# Patient Record
Sex: Female | Born: 1970 | Race: White | Hispanic: No | Marital: Married | Smoking: Former smoker
Health system: Southern US, Community
[De-identification: ages and names within clinical notes are randomized; demographics above are authoritative.]

## PROBLEM LIST (undated history)

## (undated) DIAGNOSIS — R609 Edema, unspecified: Secondary | ICD-10-CM

## (undated) DIAGNOSIS — R61 Generalized hyperhidrosis: Secondary | ICD-10-CM

## (undated) HISTORY — DX: Generalized hyperhidrosis: R61

## (undated) HISTORY — PX: INTRAUTERINE DEVICE (IUD) INSERTION: SHX5877

## (undated) HISTORY — PX: MOUTH SURGERY: SHX715

## (undated) HISTORY — PX: FOOT SURGERY: SHX648

## (undated) HISTORY — PX: OTHER SURGICAL HISTORY: SHX169

## (undated) HISTORY — DX: Edema, unspecified: R60.9

---

## 1998-08-16 HISTORY — PX: MANDIBLE SURGERY: SHX707

## 2001-11-21 ENCOUNTER — Other Ambulatory Visit: Admission: RE | Admit: 2001-11-21 | Discharge: 2001-11-21 | Payer: Self-pay | Admitting: Obstetrics and Gynecology

## 2001-12-29 ENCOUNTER — Ambulatory Visit (HOSPITAL_COMMUNITY): Admission: RE | Admit: 2001-12-29 | Discharge: 2001-12-29 | Payer: Self-pay | Admitting: Obstetrics and Gynecology

## 2001-12-29 ENCOUNTER — Encounter: Payer: Self-pay | Admitting: Obstetrics and Gynecology

## 2002-06-01 ENCOUNTER — Encounter (HOSPITAL_COMMUNITY): Admission: RE | Admit: 2002-06-01 | Discharge: 2002-06-15 | Payer: Self-pay | Admitting: Obstetrics and Gynecology

## 2002-06-15 ENCOUNTER — Inpatient Hospital Stay (HOSPITAL_COMMUNITY): Admission: AD | Admit: 2002-06-15 | Discharge: 2002-06-20 | Payer: Self-pay | Admitting: Obstetrics and Gynecology

## 2002-12-05 ENCOUNTER — Other Ambulatory Visit: Admission: RE | Admit: 2002-12-05 | Discharge: 2002-12-05 | Payer: Self-pay | Admitting: Obstetrics and Gynecology

## 2004-04-13 ENCOUNTER — Other Ambulatory Visit: Admission: RE | Admit: 2004-04-13 | Discharge: 2004-04-13 | Payer: Self-pay | Admitting: Obstetrics and Gynecology

## 2005-12-03 ENCOUNTER — Other Ambulatory Visit: Admission: RE | Admit: 2005-12-03 | Discharge: 2005-12-03 | Payer: Self-pay | Admitting: Obstetrics and Gynecology

## 2006-02-02 ENCOUNTER — Inpatient Hospital Stay (HOSPITAL_COMMUNITY): Admission: AD | Admit: 2006-02-02 | Discharge: 2006-02-02 | Payer: Self-pay | Admitting: Obstetrics and Gynecology

## 2006-05-27 ENCOUNTER — Inpatient Hospital Stay (HOSPITAL_COMMUNITY): Admission: RE | Admit: 2006-05-27 | Discharge: 2006-05-31 | Payer: Self-pay | Admitting: Obstetrics and Gynecology

## 2008-07-18 ENCOUNTER — Encounter (INDEPENDENT_AMBULATORY_CARE_PROVIDER_SITE_OTHER): Payer: Self-pay | Admitting: Obstetrics and Gynecology

## 2008-07-18 ENCOUNTER — Ambulatory Visit (HOSPITAL_COMMUNITY): Admission: RE | Admit: 2008-07-18 | Discharge: 2008-07-18 | Payer: Self-pay | Admitting: Obstetrics and Gynecology

## 2009-05-28 ENCOUNTER — Encounter: Admission: RE | Admit: 2009-05-28 | Discharge: 2009-05-28 | Payer: Self-pay | Admitting: Obstetrics and Gynecology

## 2010-12-29 NOTE — H&P (Signed)
NAME:  Teresa Ashley, Teresa Ashley NO.:  [id number]   MEDICAL RECORD NO.:  [id number]          PATIENT TYPE:  AMB   LOCATION:  SDC                           FACILITY:  [facility]   PHYSICIAN:  Huel Cote, M.D. DATE OF BIRTH:  [birth year]   DATE OF ADMISSION:  DATE OF DISCHARGE:                              HISTORY & PHYSICAL   PREOPERATIVE HISTORY AND PHYSICAL:  The patient is a 40 year old G3, P 2-  0-0-2 who is coming in for a scheduled suction dilation and evacuation  given an unfortunate finding of a fetal demise at 11+ weeks gestation.  The patient was having no significant symptoms and came in for a routine  ultrasound and was found to have absent cardiac activity on her fetus.  After given all options, she elected to proceed with suction D and E.   PAST OBSTETRICAL HISTORY:  Is significant for two prior pregnancies,  both delivered by C-section in 2003 and 2007.   PAST GYNECOLOGICAL HISTORY:  Cryosurgery in the 1990s.   PAST SURGICAL HISTORY:  1. C-section x2.  2. Breast augmentation.  3. Jaw surgery.   PAST MEDICAL HISTORY:  None.   FAMILY HISTORY:  No breast cancer.   MEDICATIONS:  Prenatal vitamins only.   PHYSICAL EXAM:  Blood pressure is 108/60.  CARDIAC:  Exam is regular rate and rhythm.  LUNGS:  Clear.  ABDOMEN:  Is soft and nontender.  PELVIC EXAM:  The uterus is consistent with an 11-[redacted] week gestation.  Adnexa have no masses.   The patient was counseled as to all options including Cytotec and  expected management.  Given that she is rather advanced in the first  trimester, we discussed the possibility of significant bleeding when the  pregnancy tissue should indeed pass.  Because of these issues she has  elected to proceed with a suction dilation and evacuation.  We  discussed the risk of the procedure including bleeding and uterine  perforation specifically.  She understands these risks and desires to  proceed with the surgery as stated.  We also  discussed the possibility  that we could send the tissue for genetics and the patient will discuss  this with her husband and decide if she wishes to proceed with this.      Huel Cote, M.D.  Electronically Signed     KR/MEDQ  D:  07/17/2008  T:  07/17/2008  Job:  [id number]

## 2010-12-29 NOTE — Op Note (Signed)
Teresa Ashley, CHAUNCEY               ACCOUNT NO.:  [id number]   MEDICAL RECORD NO.:  [id number]          PATIENT TYPE:  AMB   LOCATION:  SDC                           FACILITY:  [facility]   PHYSICIAN:  Huel Cote, M.D. DATE OF BIRTH:  March 09, 1971   DATE OF PROCEDURE:  07/18/2008  DATE OF DISCHARGE:                               OPERATIVE REPORT   PREOPERATIVE DIAGNOSIS:  Missed abortion at 12+ weeks.   POSTOPERATIVE DIAGNOSIS:  Missed abortion at 12+ weeks.   PROCEDURE:  Suction, dilation, and evacuation.   SURGEON:  Huel Cote, MD   ASSISTANT:  None.   ANESTHESIA:  LMA with 1% lidocaine plain paracervical block.   FINDINGS:  Large amount of POCs noted.  Uterus was prominent at 3 below  the umbilicus.   SPECIMEN:  Large amount of POCs were obtained and sent to Pathology as  well as chromosomal analysis.   ESTIMATED BLOOD LOSS:  100 mL.   URINE OUTPUT:  50 mL straight cath prior to procedure.   IV FLUIDS:  1200 mL of LR.   DESCRIPTION OF PROCEDURE:  The patient was taken to the operating room,  where LMA anesthesia was obtained without difficulty.  She was then  prepped and draped in normal sterile fashion in dorsal lithotomy  position.  After a time-out was performed, the cervix was identified,  grasped with single-tooth tenaculum, and a paracervical block performed  at 2 and 10 o'clock with 10 mL of lidocaine.  The cervix was noted to be  partially dilated already from her Cytotec use and sounded to  approximately 16.  The cervix was then easily sequentially dilated with  a Pratt dilator to a maximum of 36 and the 12-mm suction curette was  attempted to be placed in the uterine fundus.  It was slightly difficult  to advance this into the fundus, so therefore this was abandoned, and a  10-mm suction curette easily introduced into the fundus.  This was then  applied to suction, and a large amount of products of conception were  obtained in multiple passes.  When there  was no further tissue  apparently forthcoming, the suction was discontinued and a gentle  curettage performed in all quadrants.  No other significant tissue was  noted; however, it was slightly soft on the patient's left side.  Therefore, the suction was reintroduced and no further significant  tissue obtained.  Gentle curettage was once again performed, and no  further bleeding or tissue noted.  The uterus was examined and felt to  be more consistent with a 6-8 weeks' size now, and therefore all  instruments and sponges were removed from the patient's vagina.  Her  bleeding was observed and was minimal.  The tenaculum was removed and  did bleed  somewhat at the tenaculum site, which was controlled with silver nitrate  as well as a tenaculum.  The bleeding then completely hemostatic.  All  instruments, sponges were removed from the vagina, and the patient was  awakened and taken to the recovery room in good condition.      Huel Cote, M.D.  Electronically Signed     KR/MEDQ  D:  07/18/2008  T:  07/18/2008  Job:  [id number]

## 2011-01-01 NOTE — Op Note (Signed)
NAME:  Teresa Ashley, Teresa Ashley                         ACCOUNT NO.:  [id number]   MEDICAL RECORD NO.:  [id number]                   PATIENT TYPE:  INP   LOCATION:  9125                                 FACILITY:  [facility]   PHYSICIAN:  Zenaida Niece, M.D.             DATE OF BIRTH:  Apr 28, 1971   DATE OF PROCEDURE:  06/16/2002  DATE OF DISCHARGE:                                 OPERATIVE REPORT   PREOPERATIVE DIAGNOSES:  1. Intrauterine pregnancy at 42 weeks'.  2. Arrest of dilatation.   POSTOPERATIVE DIAGNOSES:  1. Intrauterine pregnancy at 42 weeks'.  2. Arrest of dilatation.   PROCEDURE:  Primary low transverse cesarean section without extensions.   SURGEON:  Zenaida Niece, M.D.   ANESTHESIA:  Epidural.   ESTIMATED BLOOD LOSS:  1000 cc.   FINDINGS:  The patient had normal anatomy and delivered a viable female  infant with Apgars of 8 and 9 that weight 8 pounds 10 ounces.   PROCEDURE IN DETAIL:  The patient was taken to the operating room and placed  in the dorsal supine position with left lateral tilt.  Her previously placed  epidural was dosed appropriately and her abdomen was prepped and draped in  the usual sterile fashion.  The level of her anesthesia was found to be  adequate and her abdomen was entered via a standard Pfannenstiel incision.  The vesicouterine peritoneum was incised and a bladder flap created  digitally.  The 4-cm transverse incision was made in the lower uterine  segment.  Once the uterine cavity was entered, the incision was extended  bilaterally digitally.  The fetal vertex was grasped and delivered through  the incision atraumatically.  The mouth and nares were suctioned and the  remainder of the infant delivered atraumatically.  The cord was doubly  clamped and cut and the infant handed to the waiting pediatric team.  Cord  blood and a segment of cord for gas were obtained and the placenta delivered  spontaneously.  The uterus was wiped dry with a  clean lap pad and found to  be normal.  The uterine incision was inspected and found to be free of  extensions.  The uterine incision was closed in one layer being a running,  locking layer with 1 chromic.  Bleeding from the left angle was controlled  with two figure-of-eight sutures of 1 chromic and bleeding from serosal  edges was confirmed with electrocautery.  Both tubes and ovaries were  inspected and found to be hemostatic.  The incision was again inspected and  found to be hemostatic.  The subfascial space was then irrigated and made  hemostatic with electrocautery.  The fascia was closed in a running fashion  with 0 Vicryl.  Subcutaneous tissue irrigated and made  hemostatic with electrocautery and the skin was closed with staples and a  sterile dressing.  The patient tolerated the procedure well and was taken  to  the recovery room in stable condition.  Counts were correct x2 and she was  given Ancef 1 g after cord clamped.                                                Zenaida Niece, M.D.    TDM/MEDQ  D:  06/16/2002  T:  06/16/2002  Job:  [id number]

## 2011-01-01 NOTE — Discharge Summary (Signed)
NAME:  Teresa Ashley, Teresa Ashley                         ACCOUNT NO.:  [id number]   MEDICAL RECORD NO.:  [id number]                   PATIENT TYPE:  INP   LOCATION:  9125                                 [facility]ACILITY:  [facility]   PHYSICIAN:  Zenaida Niece, M.D.             DATE OF BIRTH:  Dec 19, 1970   DATE OF ADMISSION:  06/15/2002  DATE OF DISCHARGE:  06/20/2002                                 DISCHARGE SUMMARY   ADMISSION DIAGNOSES:  1. Intrauterine pregnancy at 42 weeks.  2. Group B Strep carrier.   DISCHARGE DIAGNOSES:  1. Intrauterine pregnancy at 42 weeks.  2. Group B Strep carrier.  3. Arrest of dilation.   PROCEDURE:  On November 1 she had a primary low transverse cesarean section.   HISTORY AND PHYSICAL:  This is a 40 year old white female gravida 1, para 0  with an EGA of [redacted] weeks by an LMP consistent with a 10 week ultrasound with  a due date of October 16 who presents for induction due to post dates.  She  has had a few contractions, good fetal movement, no bleeding, and no rupture  of membranes.  Prenatal care has been essentially uncomplicated with  reactive nonstress tests since going past her due date.   PRENATAL LABORATORIES:  Blood type is O+ with a negative antibody screen.  RPR nonreactive.  Rubella immune.  Hepatitis B surface antigen negative.  Gonorrhea and Chlamydia negative.  Genital HSV culture was negative.  Triple  screen normal.  One hour Glucola 108.  Group B Strep is positive.   GYN HISTORY:  History of cryo therapy in the 1990s.   PAST SURGICAL HISTORY:  1. Fractured jaw.  2. Tonsillectomy.  3. Breast augmentation.   ALLERGIES:  CODEINE.   PHYSICAL EXAMINATION:  VITAL SIGNS:  She is afebrile with stable vital  signs.  Fetal heart tracing is reactive with irregular contractions.  ABDOMEN:  Gravid, nontender with an estimated fetal weight of 8 pounds.  PELVIC:  Vaginal examination is fingertip, 80, -2, vertex presentation.   HOSPITAL COURSE:  The  patient was admitted and started on Pitocin for labor  induction.  On the afternoon of October 31 she was 1, 90, and -1 and I was  able to perform amniotomy which revealed clear fluid.  At that time she was  started on penicillin for group B Strep prophylaxis.  She entered active  labor and received an epidural.  However, she reached 9 cm and stayed there  for approximately three to four hours without change.  She appeared to be  having adequate contractions.  At that time the situation was discussed with  the patient and her husband and we agreed to proceed with a cesarean  section.  Early on the morning of November 1 she had a primary low  transverse cesarean section without extension under epidural anesthesia.  Estimated blood loss was 1000 cc.  She  delivered a viable female infant with  Apgars of 8 and 9 that weighed 8 pounds 10 ounces.  Postoperatively she did  very well, was rapidly able to ambulate and tolerate a diet, and remained  afebrile.  Pre delivery hemoglobin 12.9, post delivery 9.9.  The patient  wished to stay until the morning of November 5 which was postoperative day  number four.  At that time her incision was healing well and her staples  were removed and Steri-Strips applied.  She was felt to be stable enough for  discharge home.   DISCHARGE INSTRUCTIONS:  Regular diet.  Pelvic rest.  No strenuous activity.  Medications are Percocet number 30 one to two p.o. q.4-6h. p.r.n. as well as  over-the-counter Motrin or Aleve p.r.n.  She is given our discharge  pamphlet.                                               Zenaida Niece, M.D.    TDM/MEDQ  D:  06/20/2002  T:  06/20/2002  Job:  [id number]

## 2011-01-01 NOTE — H&P (Signed)
Teresa Ashley, Teresa Ashley               ACCOUNT NO.:  [id number]   MEDICAL RECORD NO.:  [id number]          PATIENT TYPE:  INP   LOCATION:  9142                          [facility]ACILITY:  [facility]   PHYSICIAN:  Huel Cote, M.D. DATE OF BIRTH:  1970/10/08   DATE OF ADMISSION:  05/27/2006  DATE OF DISCHARGE:                                HISTORY & PHYSICAL   ANTICIPATED DATE OF SURGERY:  May 27, 2006.   HISTORY OF THE PRESENT ILLNESS:  The patient is a 40 year old gravida 2,  para 1, 0, 0, 1 who is coming in at essentially [redacted] weeks gestation for a  repeat cesarean section given a history of a prior cesarean section and  declining a trial of labor.  Her prenatal care had been essentially  uncomplicated.  She does have advanced maternal age and therefore underwent  first trimester screening, which was normal.  She is a group B Strep  carrier; and, aside from her previous cesarean section, which was performed  in 2003 for arrest of dilation, she had no other significant abnormalities  in her prenatal course.   PRENATAL LABORATORY DATA:  The patient's prenatal labs are as follows:  O  positive, antibody negative.  RPR nonreactive.  Rubella immune.  Hepatitis B  surface antigen negative.  HIV negative.  GC and Chlamydia negative.  First  trimester screen normal.  Group B Strep positive.   PAST OBSTETRICAL HISTORY:  Significant for the cesarean section, low  transverse, performed in November 2003 of an 8 pound 10 ounce female infant.   PAST GYNECOLOGICAL HISTORY:  The patient had cryosurgery performed in the  1990s.  Paps have been normal since that time.   PAST MEDICAL HISTORY:  None.   PAST SURGICAL HISTORY:  1. The patient had jaw surgery times two in 2000.  2. Breast augmentation in 1996.  3. Tonsillectomy in 1976.   ALLERGIES:  The patient has an allergy only to CODEINE.   MEDICATIONS:  The patient is currently taking her prenatal vitamins only.   PHYSICAL EXAMINATION:  VITAL  SIGNS:  On physical exam her weight is 181  pounds, blood pressure is 134/80.  HEART:  Cardiac examination reveals regular rate and rhythm.  LUNGS:  The lungs are clear.  ABDOMEN:  The abdomen is soft, gravid and nontender.  Estimated fetal weight  is 8.5-9 pounds.  PELVIC EXAMINATION:  Cervix is long and closed.   NOTE:  The patient was counseled as to the risks and benefits of proceeding  with a cesarean section including bleeding, infection and possible damage to  bowel and/or bladder.  She understands that she has a slightly increased  risk of more blood loss with a cesarean section; however, would not  routinely need a transfusion only if this was necessary due to emergency  bleeding.  She is okay with this.  The procedure was discussed with her in  detail and she is agreeable to proceeding.      Huel Cote, M.D.  Electronically Signed     KR/MEDQ  D:  05/26/2006  T:  05/27/2006  Job:  [id number]

## 2011-01-01 NOTE — Op Note (Signed)
NAMEGLADYS, Teresa Ashley               ACCOUNT NO.:  [id number]   MEDICAL RECORD NO.:  [id number]          PATIENT TYPE:  INP   LOCATION:  9142                          [facility]ACILITY:  [facility]   PHYSICIAN:  Huel Cote, M.D. DATE OF BIRTH:  1971/01/12   DATE OF PROCEDURE:  05/27/2006  DATE OF DISCHARGE:                                 OPERATIVE REPORT   PREOPERATIVE DIAGNOSES:  1. Term pregnancy at 39 weeks. delivered.  2. Previous cesarean section, declines vaginal birth after cesarean.   POSTOPERATIVE DIAGNOSES:  1. Term pregnancy at 39 weeks. delivered.  2. Previous cesarean section, declines vaginal birth after cesarean.   PROCEDURE:  Repeat low transverse C-section, with two-layer closure of  uterus.   SURGEON:  Huel Cote, M.D.   ASSISTANT:  Zenaida Niece, M.D.   ANESTHESIA:  Spinal.   Placenta was sent to labor and delivery.   ESTIMATED BLOOD LOSS:  800 cc.   IV FLUIDS:  2200 cc LR.   URINE OUTPUT:  150 cc clear urine.   FINDINGS:  There was a vigorous female infant in vertex presentation.  Apgars  were 8 and 9.  Weight was 8 pounds 13 ounces. Normal uterus, ovaries, and  tubes were noted.   PROCEDURE:  The patient was taken to the operating room where spinal  anesthesia was obtained without difficulty.  She was then prepped and draped  in the normal sterile fashion in the dorsal supine position with a leftward  tilt. After Allis clamp test confirmed that her spinal was functioning, a  Pfannenstiel skin incision was made, with Foley catheter in place.  This was  carried down to the underlying layer of fascia by sharp dissection and Bovie  cautery.  Fascia was opened in the midline.  The incision was extended  laterally.  The inferior aspect was grasped with Kocher clamps and dissected  off the underlying rectus muscles.  In similar fashion, the superior aspect  was dissected off the rectus muscles.  The rectus muscles were separated  midline, and the  peritoneal incision was extended both superiorly and  inferiorly, with careful attention to avoid both bowel and bladder.  The  Alexis self-retaining large wound retractor was then placed within the  incision, and the lower uterine segment was then exposed nicely.  The lower  uterine segment was then incised in a transverse fashion, and the uterine  cavity itself entered bluntly.  This incision was then extended bluntly.  The infant's head was then delivered atraumatically.  The mouth and nose  were bulb suctioned, and the remainder of the infant delivered without  difficulty.  The cord was clamped and cut.  The infant was handed off to the  awaiting pediatricians.  Cord blood was obtained, and the placenta was  delivered intact and clamped for cord blood donation. The uterus was then  cleared of all clots and debris with a moist lap sponge.  There was some  mild atony which required bimanual massage and Pitocin. However, this  resolved well.  The uterine incision was then closed with 0 chromic in a  running-locked  fashion.  A  second layer of the same was used in an  imbricating fashion.  Good hemostasis was noted at the end of the closure.  The uterus and ovaries and tubes appeared normal. At this point, all  instruments and sponges were removed from the patient's abdomen, and the  abdomen and pelvis irrigated, with no active bleeding noted.  All appeared  hemostatic in the subfascial planes. Therefore, the fascia was closed with 0  Vicryl in a running fashion, and the skin was closed with staples.  Sponge,  lap, and needle counts were correct x2, and the patient was taken to the  recovery room in stable condition.      Huel Cote, M.D.  Electronically Signed     KR/MEDQ  D:  05/27/2006  T:  05/28/2006  Job:  [id number]

## 2011-01-01 NOTE — Discharge Summary (Signed)
Teresa Ashley, Teresa Ashley               ACCOUNT NO.:  [id number]   MEDICAL RECORD NO.:  [id number]          PATIENT TYPE:  INP   LOCATION:  9142                          [facility]ILITY[facility]   PHYSICIAN:  Huel Cote, M.D. DATE OF BIRTH:  [birth year]   DATE OF ADMISSION:  05/27/2006  DATE OF DISCHARGE:  05/31/2006                               DISCHARGE SUMMARY   DISCHARGE DIAGNOSES:  1. Term pregnancy at 39 weeks, delivered.  2. Previous cesarean section, declining vaginal birth after cesarean      section.  3. Status post repeat low-transverse cesarean section with 2-layer      closure of uterus.   DISCHARGE MEDICATIONS:  1. Motrin 600 mg p.o. every 6 hours.  2. Percocet 1-2 tablets p.o. every 4 hours p.r.n.   DISCHARGE FOLLOWUP:  The patient is to follow up in the office in 2  weeks for an incision check.   HOSPITAL COURSE:  The patient is a 40 year old G2, P1-0-0-1 who was  admitted for a repeat low-transverse C-section, which she underwent on  May 27, 2006, without difficulty.  She was delivered of a vigorous  female infant, Apgars were 8 and 9, weight was 8 pounds 13 ounces.  She  had normal anatomy noted at the time of C-section.  She was then  admitted for routine postoperative care.  She did quite well.   On postop day #1, her hemoglobin was noted to be stable at 10.5.  Throughout her postoperative course, she remained afebrile with stable  vital signs, and her pain became well controlled.   She was afebrile with stable vital signs on postop day #4.  Her staples  were removed, Steri-Strips placed, and she was given prescriptions for  Motrin and Percocet as stated.  The patient was also given discharge  booklet and instructions on pelvic rest.  She will follow up in the  office in 2 weeks for an incision check.      Huel Cote, M.D.  Electronically Signed     KR/MEDQ  D:  07/07/2006  T:  07/07/2006  Job:  [id number]

## 2011-03-29 ENCOUNTER — Other Ambulatory Visit: Payer: Self-pay | Admitting: Obstetrics and Gynecology

## 2011-03-29 DIAGNOSIS — Z1231 Encounter for screening mammogram for malignant neoplasm of breast: Secondary | ICD-10-CM

## 2011-04-08 ENCOUNTER — Ambulatory Visit
Admission: RE | Admit: 2011-04-08 | Discharge: 2011-04-08 | Disposition: A | Discharge status: Home / Self Care | Payer: BC Managed Care – PPO | Source: Ambulatory Visit | Attending: Obstetrics and Gynecology | Admitting: Obstetrics and Gynecology

## 2011-04-08 DIAGNOSIS — Z1231 Encounter for screening mammogram for malignant neoplasm of breast: Secondary | ICD-10-CM

## 2011-05-20 LAB — CBC
HCT: 39.1 % (ref 36.0–46.0)
Hemoglobin: 13.5 g/dL (ref 12.0–15.0)
MCHC: 34.4 g/dL (ref 30.0–36.0)
MCV: 90.1 fL (ref 78.0–100.0)
Platelets: 187 10*3/uL (ref 150–400)
RBC: 4.34 MIL/uL (ref 3.87–5.11)
RDW: 14.3 % (ref 11.5–15.5)
WBC: 8.2 10*3/uL (ref 4.0–10.5)

## 2011-12-30 ENCOUNTER — Ambulatory Visit
Admission: RE | Admit: 2011-12-30 | Discharge: 2011-12-30 | Disposition: A | Discharge status: Home / Self Care | Payer: BC Managed Care – PPO | Source: Ambulatory Visit | Attending: Family Medicine | Admitting: Family Medicine

## 2011-12-30 ENCOUNTER — Other Ambulatory Visit: Payer: Self-pay | Admitting: Family Medicine

## 2011-12-30 DIAGNOSIS — M25572 Pain in left ankle and joints of left foot: Secondary | ICD-10-CM

## 2012-04-24 ENCOUNTER — Other Ambulatory Visit: Payer: Self-pay | Admitting: Obstetrics and Gynecology

## 2012-04-24 DIAGNOSIS — Z1231 Encounter for screening mammogram for malignant neoplasm of breast: Secondary | ICD-10-CM

## 2012-05-22 ENCOUNTER — Ambulatory Visit
Admission: RE | Admit: 2012-05-22 | Discharge: 2012-05-22 | Disposition: A | Discharge status: Home / Self Care | Payer: BC Managed Care – PPO | Source: Ambulatory Visit | Attending: Obstetrics and Gynecology | Admitting: Obstetrics and Gynecology

## 2012-05-22 DIAGNOSIS — Z1231 Encounter for screening mammogram for malignant neoplasm of breast: Secondary | ICD-10-CM

## 2013-05-23 ENCOUNTER — Encounter: Payer: Self-pay | Admitting: Podiatry

## 2013-05-23 ENCOUNTER — Ambulatory Visit (INDEPENDENT_AMBULATORY_CARE_PROVIDER_SITE_OTHER): Payer: 59

## 2013-05-23 ENCOUNTER — Ambulatory Visit (INDEPENDENT_AMBULATORY_CARE_PROVIDER_SITE_OTHER): Payer: 59 | Admitting: Podiatry

## 2013-05-23 VITALS — BP 114/68 | HR 58 | Resp 16 | Ht 67.0 in | Wt 156.0 lb

## 2013-05-23 DIAGNOSIS — Z9889 Other specified postprocedural states: Secondary | ICD-10-CM

## 2013-05-23 DIAGNOSIS — M779 Enthesopathy, unspecified: Secondary | ICD-10-CM

## 2013-05-23 DIAGNOSIS — M2010 Hallux valgus (acquired), unspecified foot: Secondary | ICD-10-CM

## 2013-05-23 MED ORDER — MELOXICAM 15 MG PO TABS: 15.0000 mg | ORAL_TABLET | Freq: Every day | ORAL | Status: AC

## 2013-05-23 NOTE — Patient Instructions (Signed)
Use pads during the day and take mobic

## 2013-05-23 NOTE — Progress Notes (Signed)
Subjective:     Patient ID: Teresa Ashley, female   DOB: 08/02/1971, 42 y.o.   MRN: [id number]  HPI patient states that her foot is doing well after surgery. States that she is getting some pain in her forefoot and feels like it is swollen. States the pain has grown gradually more intense but is still mild currently. 8 weeks after having surgery right foot   Review of Systems  All other systems reviewed and are negative.       Objective:   Physical Exam  Nursing note and vitals reviewed. Cardiovascular: Intact distal pulses.    neurological intact bilateral incision site around the first metatarsal head and plantar heel both are doing well. Range of motion of the first MPJ is excellent. Minimal heel pain noted. I noted there to be pain in the second and third metatarsophalangeal joints right appeared the third is predominate.    Assessment:     Healing very well from bunion surgery plantar fascial surgery right. Capsulitis in the third MPJ right mild in the second MPJ right.    Plan:     H&P performed and education rendered. Reviewed x-rays. Scuffs capsulitis and explained its relationship to her healing process. Specimens metatarsal pads to take pressure off the MPJs and prescribed Mobic 15 mg daily. Reappoint one-month

## 2013-06-11 ENCOUNTER — Ambulatory Visit (INDEPENDENT_AMBULATORY_CARE_PROVIDER_SITE_OTHER): Payer: 59

## 2013-06-11 ENCOUNTER — Ambulatory Visit (INDEPENDENT_AMBULATORY_CARE_PROVIDER_SITE_OTHER): Payer: 59 | Admitting: Podiatry

## 2013-06-11 ENCOUNTER — Encounter: Payer: Self-pay | Admitting: Podiatry

## 2013-06-11 VITALS — BP 127/74 | HR 67 | Resp 12

## 2013-06-11 DIAGNOSIS — M7750 Other enthesopathy of unspecified foot: Secondary | ICD-10-CM

## 2013-06-11 DIAGNOSIS — Z9889 Other specified postprocedural states: Secondary | ICD-10-CM

## 2013-06-11 NOTE — Progress Notes (Signed)
Subjective:     Patient ID: Teresa Ashley, female   DOB: 15-Oct-1970, 42 y.o.   MRN: [id number]  HPI patient states that she was doing fine and 10 days ago she started having pain on top of her foot and mildly to the outside of her foot. Admits that she has been relatively active during this period   Review of Systems  All other systems reviewed and are negative.       Objective:   Physical Exam  Constitutional: She appears well-developed.  Cardiovascular: Intact distal pulses.   Musculoskeletal: Normal range of motion.  Neurological: She is alert.  Skin: Skin is warm.   patient right dorsal foot is sore around the second metatarsal and lateral proximal to this point in the third and fourth metatarsal proximal shaft. Moderate edema but no 1 spot that is intense     Assessment:     Possible stress fracture right foot or possible tendinitis secondary to changes in gait    Plan:     X-ray and H&P reviewed with patient. Advised on returned him mobilization ice therapy and anti-inflammatories. Reappoint in 3 weeks to recheck

## 2013-06-20 ENCOUNTER — Encounter: Payer: 59 | Admitting: Podiatry

## 2013-06-25 ENCOUNTER — Ambulatory Visit (INDEPENDENT_AMBULATORY_CARE_PROVIDER_SITE_OTHER): Payer: 59

## 2013-06-25 ENCOUNTER — Encounter: Payer: Self-pay | Admitting: Podiatry

## 2013-06-25 ENCOUNTER — Ambulatory Visit (INDEPENDENT_AMBULATORY_CARE_PROVIDER_SITE_OTHER): Payer: 59 | Admitting: Podiatry

## 2013-06-25 VITALS — BP 118/74 | HR 67 | Resp 18

## 2013-06-25 DIAGNOSIS — M79609 Pain in unspecified limb: Secondary | ICD-10-CM

## 2013-06-25 DIAGNOSIS — S93409A Sprain of unspecified ligament of unspecified ankle, initial encounter: Secondary | ICD-10-CM

## 2013-06-25 DIAGNOSIS — M79671 Pain in right foot: Secondary | ICD-10-CM

## 2013-06-25 DIAGNOSIS — M7750 Other enthesopathy of unspecified foot: Secondary | ICD-10-CM

## 2013-06-25 MED ORDER — MELOXICAM 15 MG PO TABS: 15.0000 mg | ORAL_TABLET | Freq: Every day | ORAL | Status: AC

## 2013-06-25 NOTE — Progress Notes (Signed)
°  Subjective:    Patient ID: Teresa Ashley, female    DOB: 06-03-1971, 42 y.o.   MRN: [id number]  HPI right foot surgery and i fell 06/16/2013 and hurt the ball of my foot under my 5th toe    Review of Systems     Objective:   Physical Exam        Assessment & Plan:

## 2013-06-25 NOTE — Progress Notes (Signed)
Subjective:     Patient ID: Teresa Ashley, female   DOB: 1971-07-14, 42 y.o.   MRN: [id number]  HPI patient states that she's fallen on her foot twice and that the front part of her foot is not hurting now that her ankle has been quite sore recently   Review of Systems     Objective:   Physical Exam  Nursing note and vitals reviewed. Constitutional: She is oriented to person, place, and time.  Cardiovascular: Intact distal pulses.   Musculoskeletal: Normal range of motion.  Neurological: She is oriented to person, place, and time.  Skin: Skin is warm.   patient's right dorsal ankle is very tender around the base of the tibia and into the ankle joint itself. There is no specific spot it appears to be a relatively large area that is sore     Assessment:     May have traumatized the ankle secondary to the injury she experienced or this may be some kind of inflammatory condition or possible stress fracture    Plan:     X-rays reviewed and we will continue ice oral anti-inflammatories and boot usage. Reappoint her recheck in 4 weeks

## 2013-07-16 ENCOUNTER — Ambulatory Visit (INDEPENDENT_AMBULATORY_CARE_PROVIDER_SITE_OTHER): Payer: 59 | Admitting: Podiatry

## 2013-07-16 ENCOUNTER — Encounter: Payer: Self-pay | Admitting: Podiatry

## 2013-07-16 VITALS — BP 119/75 | HR 80 | Resp 16

## 2013-07-16 DIAGNOSIS — M7750 Other enthesopathy of unspecified foot: Secondary | ICD-10-CM

## 2013-07-16 DIAGNOSIS — M722 Plantar fascial fibromatosis: Secondary | ICD-10-CM

## 2013-07-16 NOTE — Progress Notes (Signed)
Subjective:     Patient ID: Teresa Ashley, female   DOB: 1970-09-30, 42 y.o.   MRN: [id number]  HPI patient presents stating I am doing so much better with my right foot. I still note a distal Akin my forefoot if I been on it for a while but the heel or the big toe joint does not hurt at all   Review of Systems     Objective:   Physical Exam    neurovascular status intact with first MPJ range of motion of 45 dorsiflexion 30 of plantar flexion with no crepitus in the joint. Heel is doing well with no pain and moderate midfoot pain noted right Assessment:     Tendinitis with dramatic improvement of first MPJ and right heel post surgery    Plan:     Reviewed condition and advised on gradual increase in activity levels with utilization of ibuprofen to help with any pain remaining discharged unless needed

## 2015-02-21 ENCOUNTER — Ambulatory Visit (HOSPITAL_COMMUNITY)
Admission: RE | Admit: 2015-02-21 | Discharge: 2015-02-21 | Disposition: A | Discharge status: Home / Self Care | Payer: 59 | Source: Ambulatory Visit | Attending: Family Medicine | Admitting: Family Medicine

## 2015-02-21 ENCOUNTER — Encounter (HOSPITAL_COMMUNITY): Payer: Self-pay

## 2015-02-21 ENCOUNTER — Other Ambulatory Visit (HOSPITAL_COMMUNITY): Payer: Self-pay | Admitting: Family Medicine

## 2015-02-21 DIAGNOSIS — Z975 Presence of (intrauterine) contraceptive device: Secondary | ICD-10-CM | POA: Insufficient documentation

## 2015-02-21 DIAGNOSIS — K429 Umbilical hernia without obstruction or gangrene: Secondary | ICD-10-CM | POA: Diagnosis not present

## 2015-02-21 DIAGNOSIS — R1031 Right lower quadrant pain: Secondary | ICD-10-CM | POA: Diagnosis present

## 2015-02-21 MED ORDER — IOHEXOL 300 MG/ML  SOLN
100.0000 mL | Freq: Once | INTRAMUSCULAR | Status: AC | PRN
  Administered 2015-02-21: 100 mL via INTRAVENOUS

## 2015-08-13 IMAGING — CT CT ABD-PELV W/ CM
2 of 5 series · 16 of 46 positions shown, 18 images · IV contrast (Omni 300)
Comparison: None.

CLINICAL DATA: Right lower quadrant pain

EXAM:
CT ABDOMEN AND PELVIS WITH CONTRAST
TECHNIQUE: Multidetector CT imaging of the abdomen and pelvis was performed
using the standard protocol following bolus administration of
intravenous contrast.
CONTRAST:  100mL OMNIPAQUE IOHEXOL 300 MG/ML  SOLN

[Series 2: abd/ pelvis 5.0 i30f 1 · axial · 0.79mm/px · z∈[-667,-242]mm · 13 of 95 slices shown, 15 images]
[im 5/95  soft-tissue]
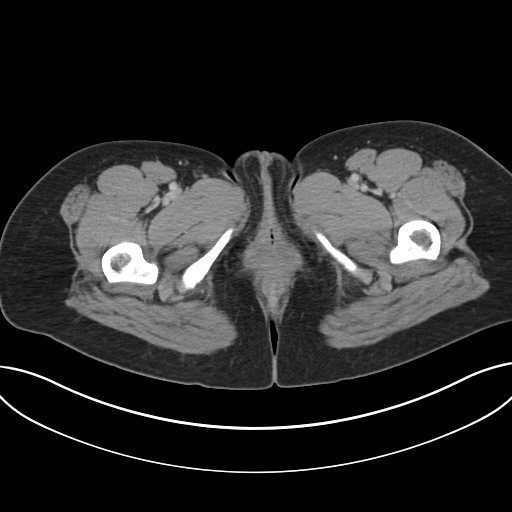
[im 5/95  bone]
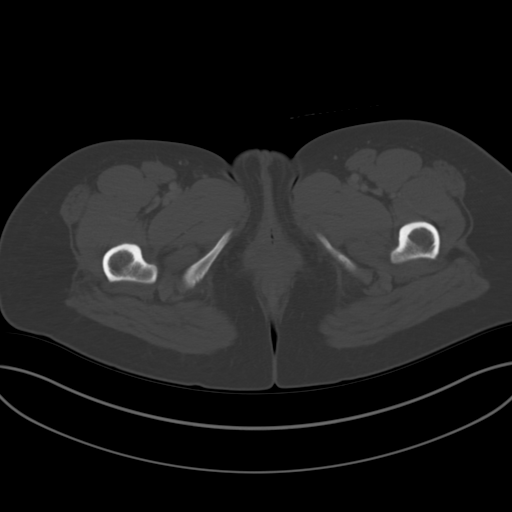
[im 15/95  soft-tissue]
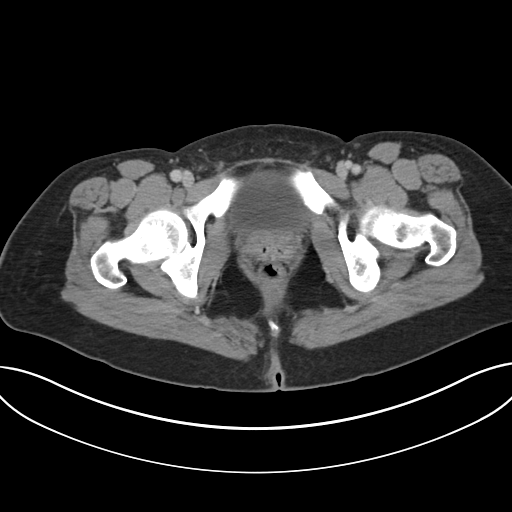
[im 20/95  soft-tissue]
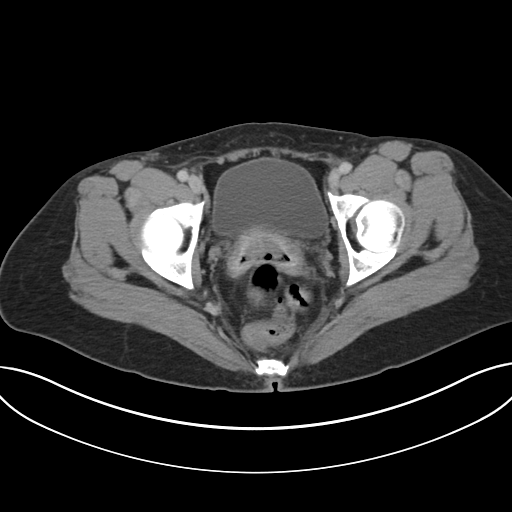
[im 25/95  soft-tissue]
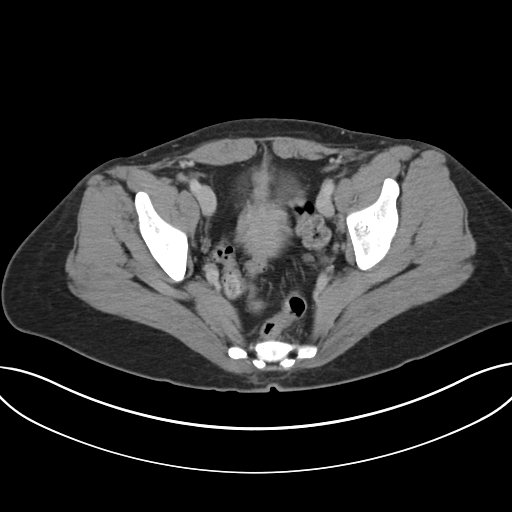
[im 35/95  soft-tissue]
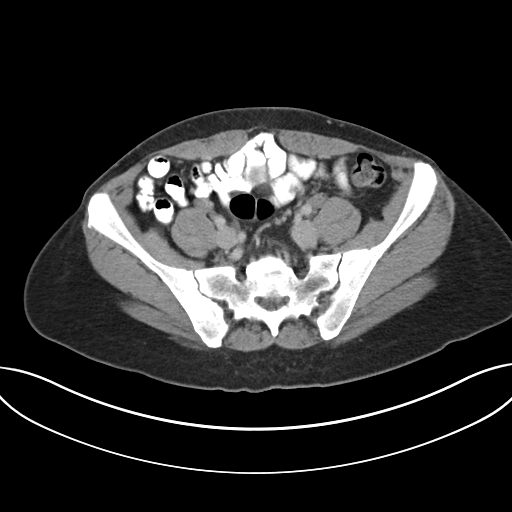
[im 40/95  soft-tissue]
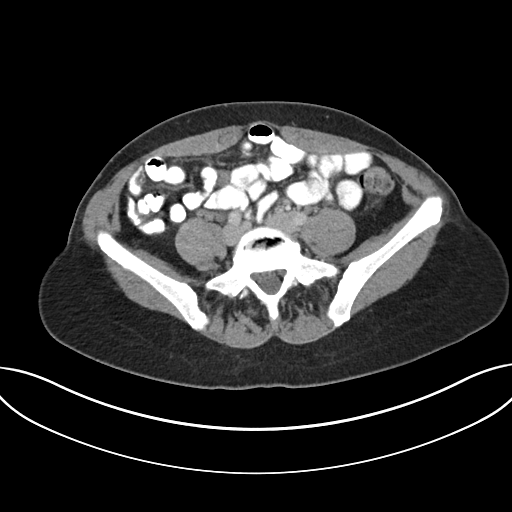
[im 50/95  soft-tissue]
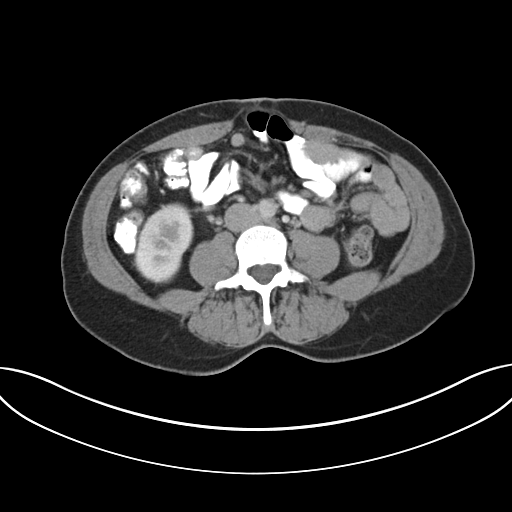
[im 55/95  soft-tissue]
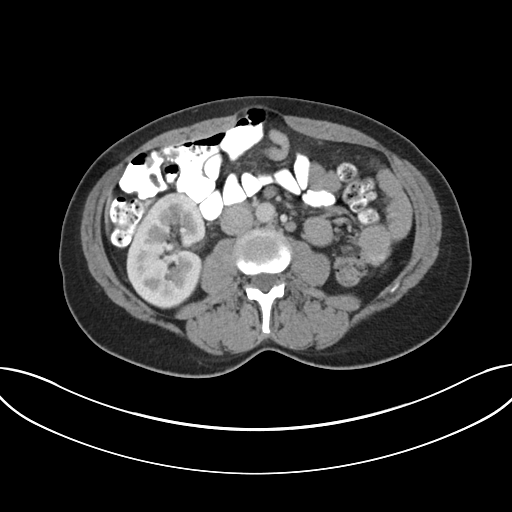
[im 60/95  soft-tissue]
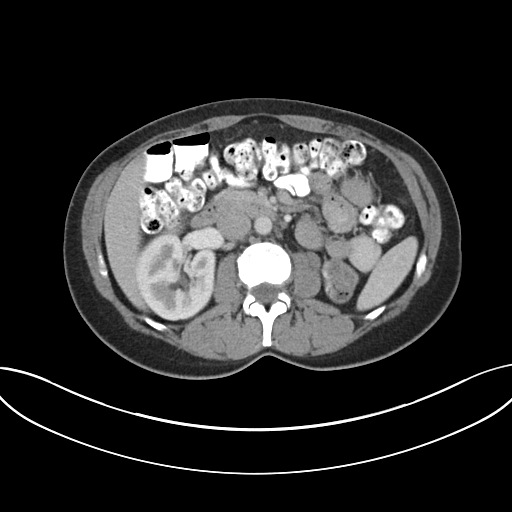
[im 60/95  bone]
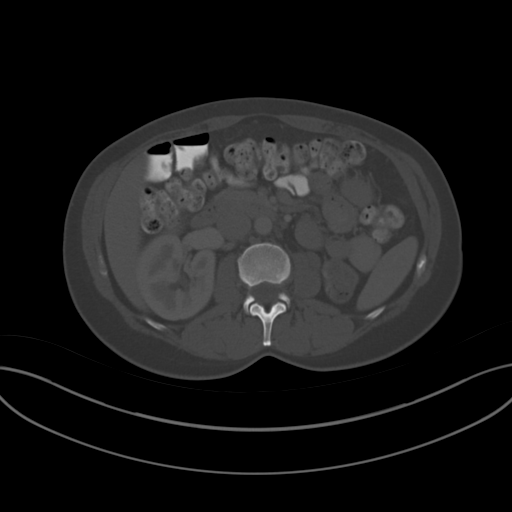
[im 70/95  soft-tissue]
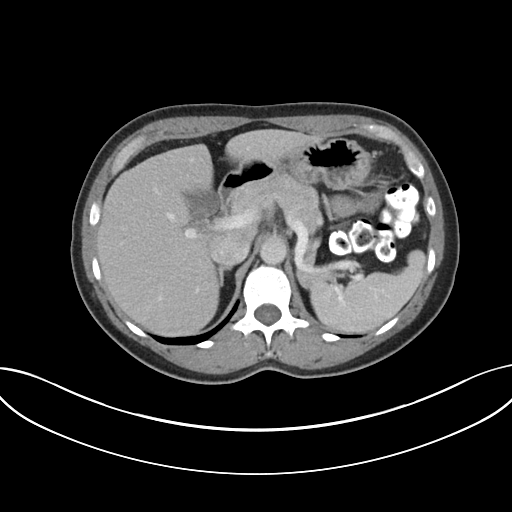
[im 75/95  soft-tissue]
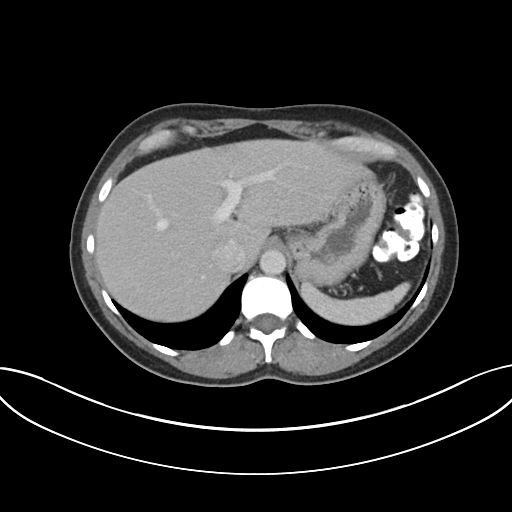
[im 80/95  soft-tissue]
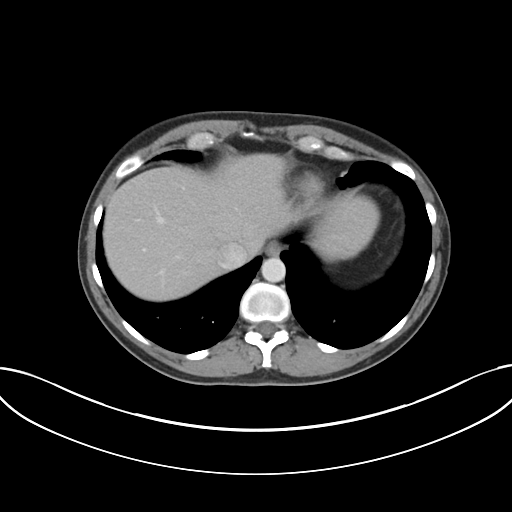
[im 90/95  soft-tissue]
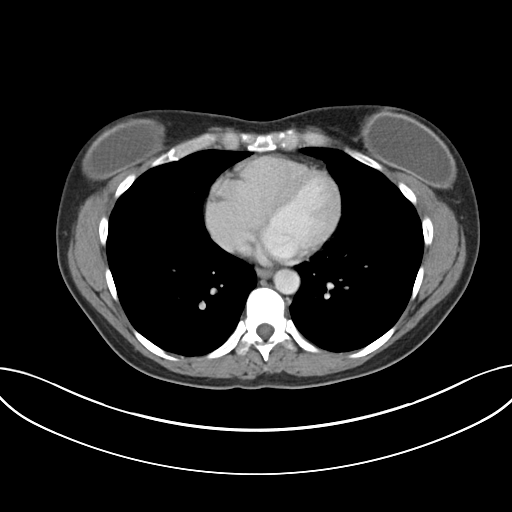

[Series 5: coronals · coronal · 0.72mm/px · 3 of 128 slices shown]
[im 43/128  soft-tissue]
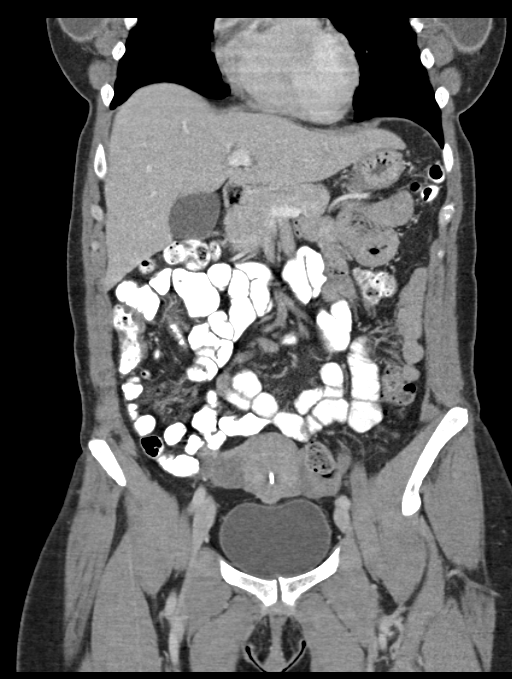
[im 57/128  soft-tissue]
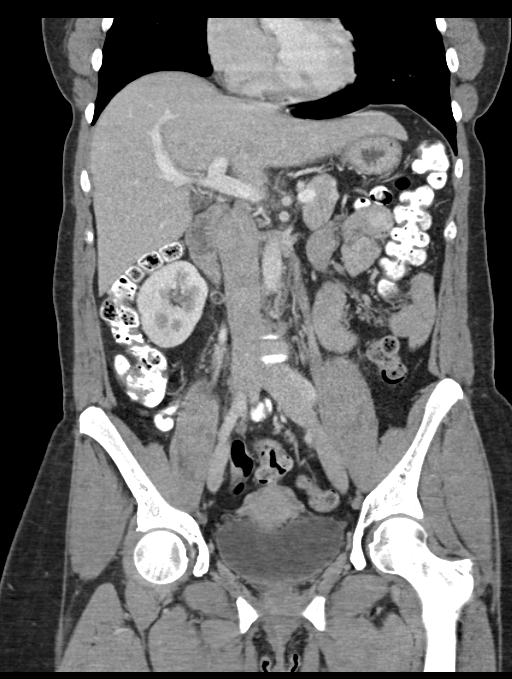
[im 71/128  soft-tissue]
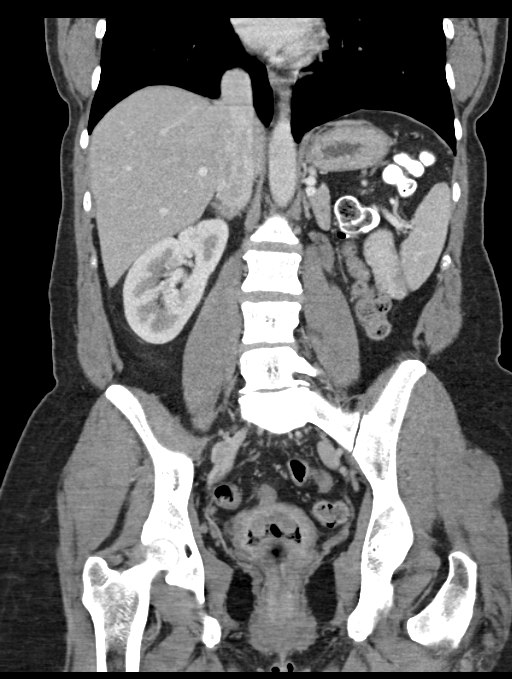

[16 of 46 positions shown; findings below may reference images not displayed]

FINDINGS: Bilateral breast implants are noted. Sagittal images of the spine
shows disc space flattening with vacuum disc phenomenon and mild
posterior disc bulge at L5-S1 level. The lung bases are
unremarkable. Enhanced liver shows no focal mass. No calcified
gallstones are noted within gallbladder. The pancreas, spleen and
adrenal glands are unremarkable. The left kidney is not identified
probable congenitally absent right kidney measures 11 cm in length.
There is normal right renal parenchymal enhancement. No
hydronephrosis or hydroureter.

There is a midline periumbilical and infraumbilical hernia
containing fat measures 2.2 by 1.3 cm without evidence of acute
complication.

No small bowel obstruction. No ascites or free air. No adenopathy.
Moderate stool are noted throughout the colon. No pericecal
inflammation. Normal appendix noted in axial image 51.

There is IUD noted within anteflexed uterus. There is a follicle
within right ovary measures 1.8 cm. No adnexal masses noted. The
urinary bladder is unremarkable
IMPRESSION: 1. No acute inflammatory process within abdomen or pelvis.
2. There is unilateral right kidney with normal appearance. The left
kidney is probable congenital absent.
3. No pericecal inflammation. Normal appendix. Moderate stool
throughout the colon.
4. There is a midline umbilical and infraumbilical hernia containing
fat measures 2.2 x 1.3 cm without evidence of acute complication.
5. Disc space flattening with vacuum disc phenomenon and mild
posterior disc bulge at L5-S1 level.
6. There is a right ovarian follicle measures 1.8 cm. IUD within
anteflexed uterus. No adnexal mass.

## 2019-06-05 ENCOUNTER — Other Ambulatory Visit: Payer: Self-pay

## 2019-06-05 DIAGNOSIS — Z20822 Contact with and (suspected) exposure to covid-19: Secondary | ICD-10-CM

## 2019-06-07 LAB — NOVEL CORONAVIRUS, NAA: SARS-CoV-2, NAA: NOT DETECTED

## 2019-07-27 ENCOUNTER — Other Ambulatory Visit: Payer: Self-pay

## 2019-07-27 DIAGNOSIS — Z20822 Contact with and (suspected) exposure to covid-19: Secondary | ICD-10-CM

## 2019-07-29 LAB — NOVEL CORONAVIRUS, NAA: SARS-CoV-2, NAA: NOT DETECTED

## 2019-07-30 ENCOUNTER — Telehealth: Payer: Self-pay | Admitting: Family Medicine

## 2019-07-30 NOTE — Telephone Encounter (Signed)
Negative COVID results given. Patient results "NOT Detected." Caller expressed understanding. ° °

## 2020-01-16 ENCOUNTER — Ambulatory Visit: Payer: Self-pay | Admitting: Podiatry

## 2020-08-06 ENCOUNTER — Ambulatory Visit (INDEPENDENT_AMBULATORY_CARE_PROVIDER_SITE_OTHER): Payer: BC Managed Care – PPO

## 2020-08-06 ENCOUNTER — Other Ambulatory Visit: Payer: Self-pay

## 2020-08-06 ENCOUNTER — Ambulatory Visit (HOSPITAL_COMMUNITY)
Admission: EM | Admit: 2020-08-06 | Discharge: 2020-08-06 | Disposition: A | Discharge status: Home / Self Care | Payer: BC Managed Care – PPO | Attending: Family Medicine | Admitting: Family Medicine

## 2020-08-06 ENCOUNTER — Encounter (HOSPITAL_COMMUNITY): Payer: Self-pay

## 2020-08-06 DIAGNOSIS — S61411A Laceration without foreign body of right hand, initial encounter: Secondary | ICD-10-CM | POA: Diagnosis not present

## 2020-08-06 DIAGNOSIS — Z23 Encounter for immunization: Secondary | ICD-10-CM

## 2020-08-06 DIAGNOSIS — R2231 Localized swelling, mass and lump, right upper limb: Secondary | ICD-10-CM

## 2020-08-06 DIAGNOSIS — S62306B Unspecified fracture of fifth metacarpal bone, right hand, initial encounter for open fracture: Secondary | ICD-10-CM

## 2020-08-06 DIAGNOSIS — W540XXA Bitten by dog, initial encounter: Secondary | ICD-10-CM

## 2020-08-06 MED ORDER — HYDROCODONE-ACETAMINOPHEN 5-325 MG PO TABS
1.0000 | ORAL_TABLET | Freq: Once | ORAL | Status: AC
  Administered 2020-08-06: 1 via ORAL

## 2020-08-06 MED ORDER — BACITRACIN ZINC 500 UNIT/GM EX OINT
TOPICAL_OINTMENT | CUTANEOUS | Status: AC
Start: 2020-08-06 — End: ?
  Filled 2020-08-06: qty 0.9

## 2020-08-06 MED ORDER — HYDROCODONE-ACETAMINOPHEN 5-325 MG PO TABS: 1.0000 | ORAL_TABLET | Freq: Four times a day (QID) | ORAL | 0 refills | Status: AC | PRN

## 2020-08-06 MED ORDER — ONDANSETRON 4 MG PO TBDP: 4.0000 mg | ORAL_TABLET | Freq: Three times a day (TID) | ORAL | 0 refills | Status: AC | PRN

## 2020-08-06 MED ORDER — LIDOCAINE HCL 2 % IJ SOLN
INTRAMUSCULAR | Status: AC
  Filled 2020-08-06: qty 20

## 2020-08-06 MED ORDER — TETANUS-DIPHTH-ACELL PERTUSSIS 5-2.5-18.5 LF-MCG/0.5 IM SUSY
PREFILLED_SYRINGE | INTRAMUSCULAR | Status: AC
  Filled 2020-08-06: qty 0.5

## 2020-08-06 MED ORDER — TETANUS-DIPHTH-ACELL PERTUSSIS 5-2.5-18.5 LF-MCG/0.5 IM SUSY
0.5000 mL | PREFILLED_SYRINGE | Freq: Once | INTRAMUSCULAR | Status: AC
  Administered 2020-08-06: 0.5 mL via INTRAMUSCULAR

## 2020-08-06 MED ORDER — AMOXICILLIN-POT CLAVULANATE 875-125 MG PO TABS: 1.0000 | ORAL_TABLET | Freq: Two times a day (BID) | ORAL | 0 refills | Status: DC

## 2020-08-06 MED ORDER — HYDROCODONE-ACETAMINOPHEN 5-325 MG PO TABS
ORAL_TABLET | ORAL | Status: AC
  Filled 2020-08-06: qty 1

## 2020-08-06 NOTE — Discharge Instructions (Signed)

## 2020-08-06 NOTE — ED Notes (Signed)
Cleansed wound, applied light coat of bacitracin to wound and wrapped with non-stick dressing and Co-band.

## 2020-08-06 NOTE — ED Triage Notes (Signed)
Pt reports that her 73-month-old puppy bit her today approx 1 hour PTA. Pt has two puncture wounds to proximal lateral palm area near wrist, large horizontal bite wound to dorsal side of hand approx 4 cm long with subcu tissue protruding, smaller laceration/wound above dorsal hand approx 1.5 cm in nature, right 3rd finger with minor abrasion, abrasion approx 2.5 cm diameter superficial in nature to left hand between left thumb and index finger. Pt able to make a fist and extend fingers of right hand. Pt reports her dog is current on rabies vaccines.  Pt uncertain last tetanus vaccine. Beatrix Fetters, NP of pt status and in for BS eval, Dr. Tracie Harrier in for second eval and advised pt can be tx here for wounds and verbal order for right hand xray received.

## 2020-08-07 NOTE — ED Provider Notes (Signed)
[facility]   [id number] 08/06/20 Arrival Time: 1628  ASSESSMENT & PLAN:  1. Laceration of right hand, foreign body presence unspecified, initial encounter   2. Unspecified fracture of fifth metacarpal bone, right hand, initial encounter for open fracture   3. Dog bite, initial encounter     I have personally viewed the imaging studies ordered this visit. Apparent very subtle proximal 5th metacarpal fx; see radiology report.  Will hold off on plaster spint or brace given wounds present on hand.  Procedure: Verbal consent obtained. Patient provided with risks and alternatives to the procedure. Wounds copiously irrigated with sterile saline then cleansed with betadine. Local anesthesia: Lidocaine 1% without epinephrine for the approx 2 cm dorsal hand wound. Wound carefully explored. No foreign body, tendon injury, or nonviable tissue were noted. Using sterile technique, 2 loose interrupted 4-0 Prolene sutures were placed to bring edges of wound closer together to facilitate healing. Procedure tolerated well. No complications. Minimal bleeding. Advised to look for and return for any signs of infection such as redness, swelling, discharge, or worsening pain. Return for suture removal in 5-7 days.   Meds ordered this encounter  Medications  . Tdap (BOOSTRIX) injection 0.5 mL  . amoxicillin-clavulanate (AUGMENTIN) 875-125 MG tablet    Sig: Take 1 tablet by mouth every 12 (twelve) hours.    Dispense:  20 tablet    Refill:  0  . HYDROcodone-acetaminophen (NORCO/VICODIN) 5-325 MG tablet    Sig: Take 1 tablet by mouth every 6 (six) hours as needed for moderate pain or severe pain.    Dispense:  15 tablet    Refill:  0  . ondansetron (ZOFRAN-ODT) 4 MG disintegrating tablet    Sig: Take 1 tablet (4 mg total) by mouth every 8 (eight) hours as needed for nausea or vomiting.    Dispense:  15 tablet    Refill:  0  . HYDROcodone-acetaminophen (NORCO/VICODIN) 5-325 MG per tablet 1  tablet    Orders Placed This Encounter  Procedures  . DG Hand Complete Right  . Apply dressing  . Apply Sling & Swathe    Recommend:  Follow-up Information    Schedule an appointment as soon as possible for a visit  with Dominica Severin, MD.   Specialty: Orthopedic Surgery Contact information: [address] [place] [address] [phone]               [place] Controlled Substances Registry consulted for this patient. I feel the risk/benefit ratio today is favorable for proceeding with this prescription for a controlled substance. Medication sedation precautions given.  Reviewed expectations re: course of current medical issues. Questions answered. Outlined signs and symptoms indicating need for more acute intervention. Patient verbalized understanding. After Visit Summary given.  SUBJECTIVE: History from: patient. Teresa Ashley is a 49 y.o. female who reports dog bite to R hand; approx 1 hour PTA. Pit bull mix. Single bite with resulting lacerations/punctures and pain. No extremity sensation changes or weakness. Some swelling noted. Rinsed under running water at home. No analgesics taken. Her dog; immun are UTD. She is unsure when her last Td was; requests booster.  Past Surgical History:  Procedure Laterality Date  . DNC    . FOOT SURGERY Right    [facility]  . INTRAUTERINE DEVICE (IUD) INSERTION    . MANDIBLE SURGERY  2000  . MOUTH SURGERY        OBJECTIVE:  Vitals:   08/06/20 1649  BP: 120/76  Pulse: 69  Resp: 18  Temp: 98.4 F (36.9 C)  TempSrc: Oral  SpO2: 98%    General appearance: alert; no distress HEENT: [place]; AT Neck: supple with FROM Resp: unlabored respirations Extremities: . UE: warm with well perfused appearance; two puncture wounds to R proximal lateral palm area near wrist, approx 2 cm horizontal wound to proximal dorsal side of R hand, smaller puncture of dorsal ulnar R hand, R distal 3rd finger with  minor abrasion, small abrasion of L hand between left thumb and index finger; able to make fist and move wrist normally but with reported pain CV: brisk extremity capillary refill of bilateral UE; 2+ radial pulse of bilateral UE. Skin: warm and dry Neurologic: gait normal; normal sensation and strength of bilateral UE Psychological: alert and cooperative; normal mood and affect  Imaging: DG Hand Complete Right  Result Date: 08/06/2020 CLINICAL DATA:  Dog bite, bleeding, swelling EXAM: RIGHT HAND - COMPLETE 3+ VIEW COMPARISON:  None. FINDINGS: Frontal, oblique, and lateral views of the right hand are obtained. On the oblique and lateral projections there are small ossific densities dorsal to the base of the fifth metacarpal. This could reflect fracture or radiopaque foreign body given history of animal bite. There is overlying soft tissue swelling and soft tissue defect. No other acute bony abnormalities. Bone island within the radius. Joint spaces are well preserved. IMPRESSION: 1. Small ossific densities dorsal to the base of the fifth metacarpal, at the site of soft tissue injury. I would suspect this reflects fracture of the base of the fifth metacarpal rather than radiopaque foreign body. Electronically Signed   By: Sharlet Salina M.D.   On: 08/06/2020 18:20      Allergies  Allergen Reactions  . Codeine Nausea And Vomiting and Nausea Only    Past Medical History:  Diagnosis Date  . Night sweats   . Swelling    FEET AND LEGS   Social History   Socioeconomic History  . Marital status: Married    Spouse name: Not on file  . Number of children: Not on file  . Years of education: Not on file  . Highest education level: Not on file  Occupational History  . Not on file  Tobacco Use  . Smoking status: Former Smoker    Types: Cigarettes    Quit date: 08/23/1993    Years since quitting: 26.9  . Smokeless tobacco: Never Used  Substance and Sexual Activity  . Alcohol use: Yes     Comment: DAILY  . Drug use: No  . Sexual activity: Not on file  Other Topics Concern  . Not on file  Social History Narrative  . Not on file   Social Determinants of Health   Financial Resource Strain: Not on file  Food Insecurity: Not on file  Transportation Needs: Not on file  Physical Activity: Not on file  Stress: Not on file  Social Connections: Not on file   Family History  Problem Relation Age of Onset  . Heart failure Mother   . Hypertension Father   . Cancer Father    Past Surgical History:  Procedure Laterality Date  . DNC    . FOOT SURGERY Right    [facility]  . INTRAUTERINE DEVICE (IUD) INSERTION    . MANDIBLE SURGERY  2000  . MOUTH SURGERY        Mardella Layman, MD 08/07/20 1036

## 2021-01-26 IMAGING — DX DG HAND COMPLETE 3+V*R*
3 series · 3 of 3 positions shown · non-contrast
Comparison: None.

CLINICAL DATA: Dog bite, bleeding, swelling

EXAM:
RIGHT HAND - COMPLETE 3+ VIEW

[hand pa]
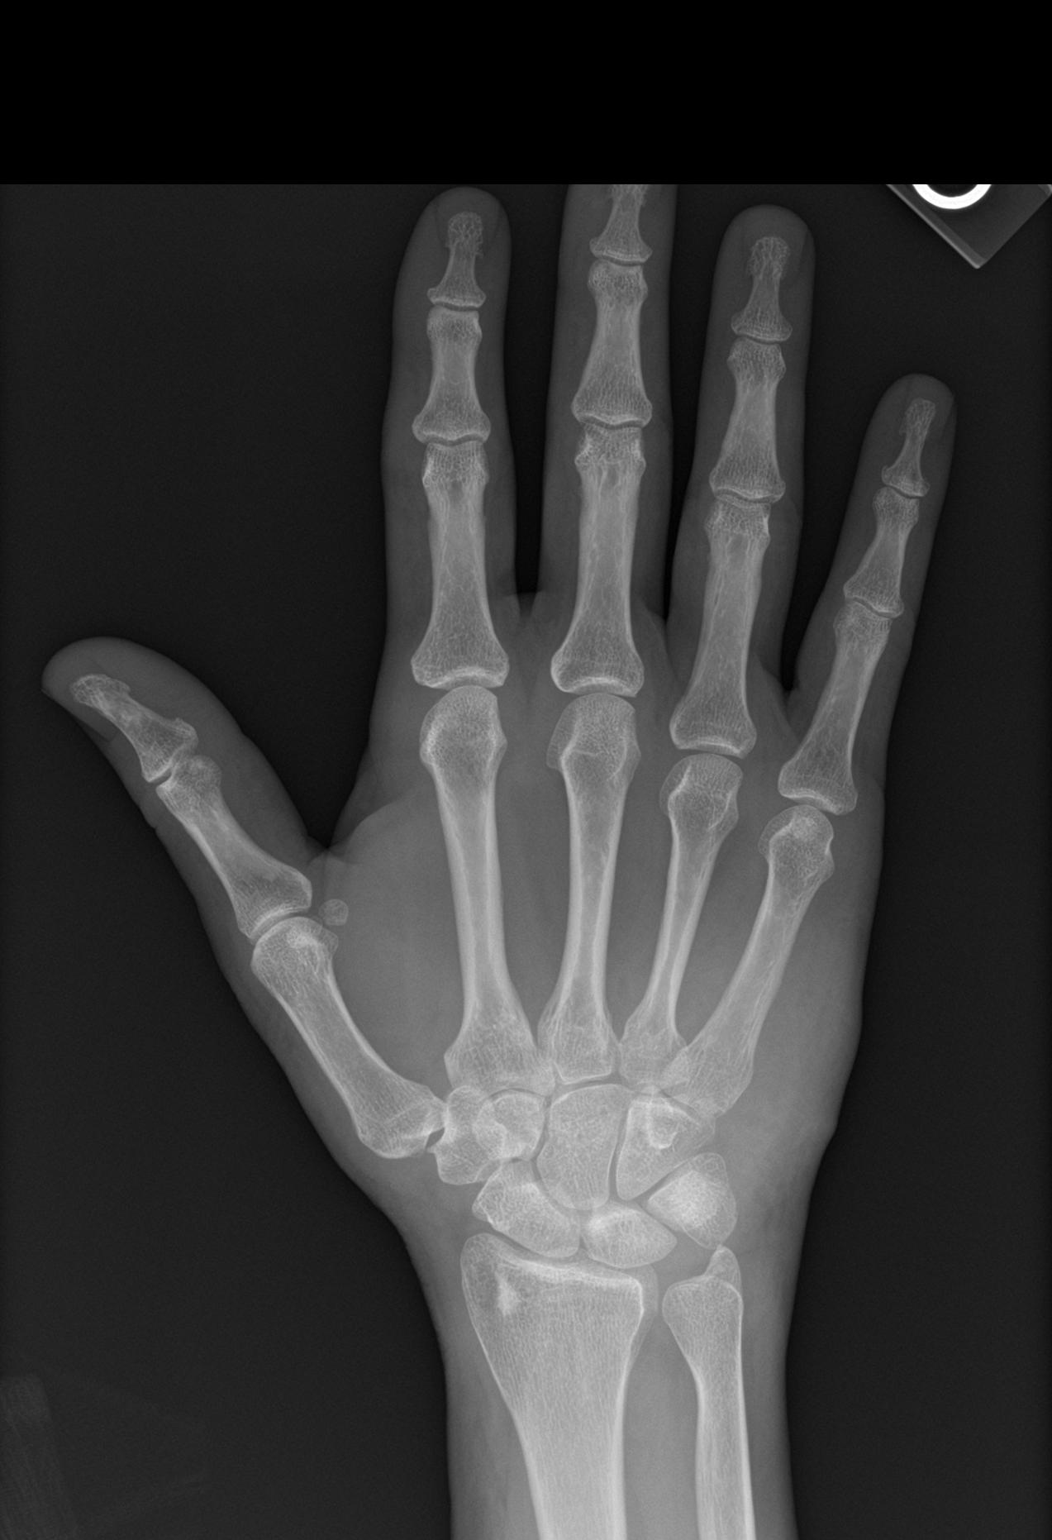

[hand obl]
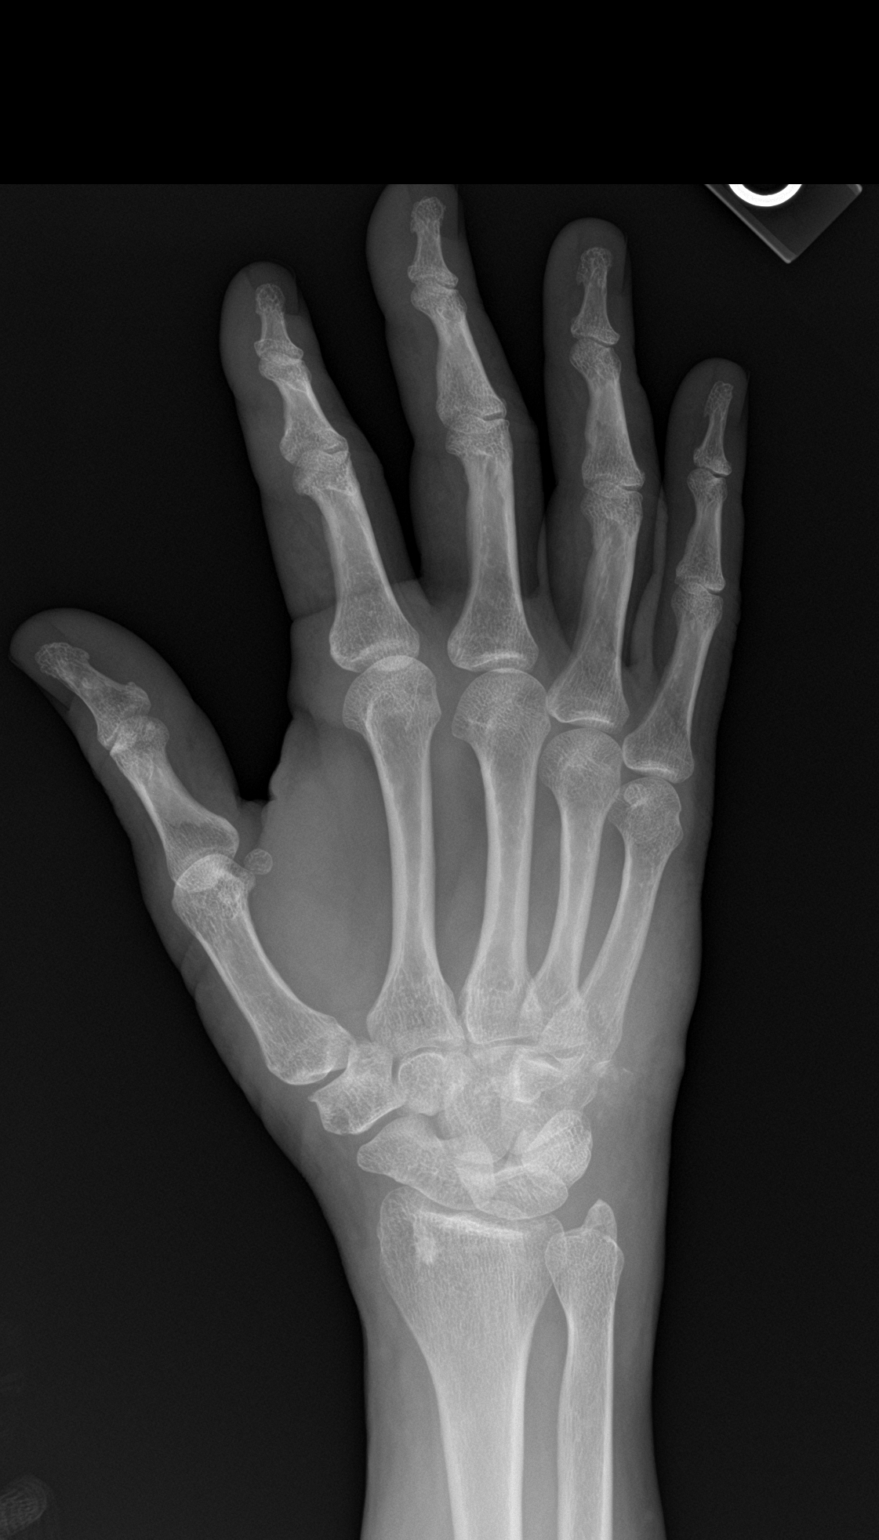

[hand lat]
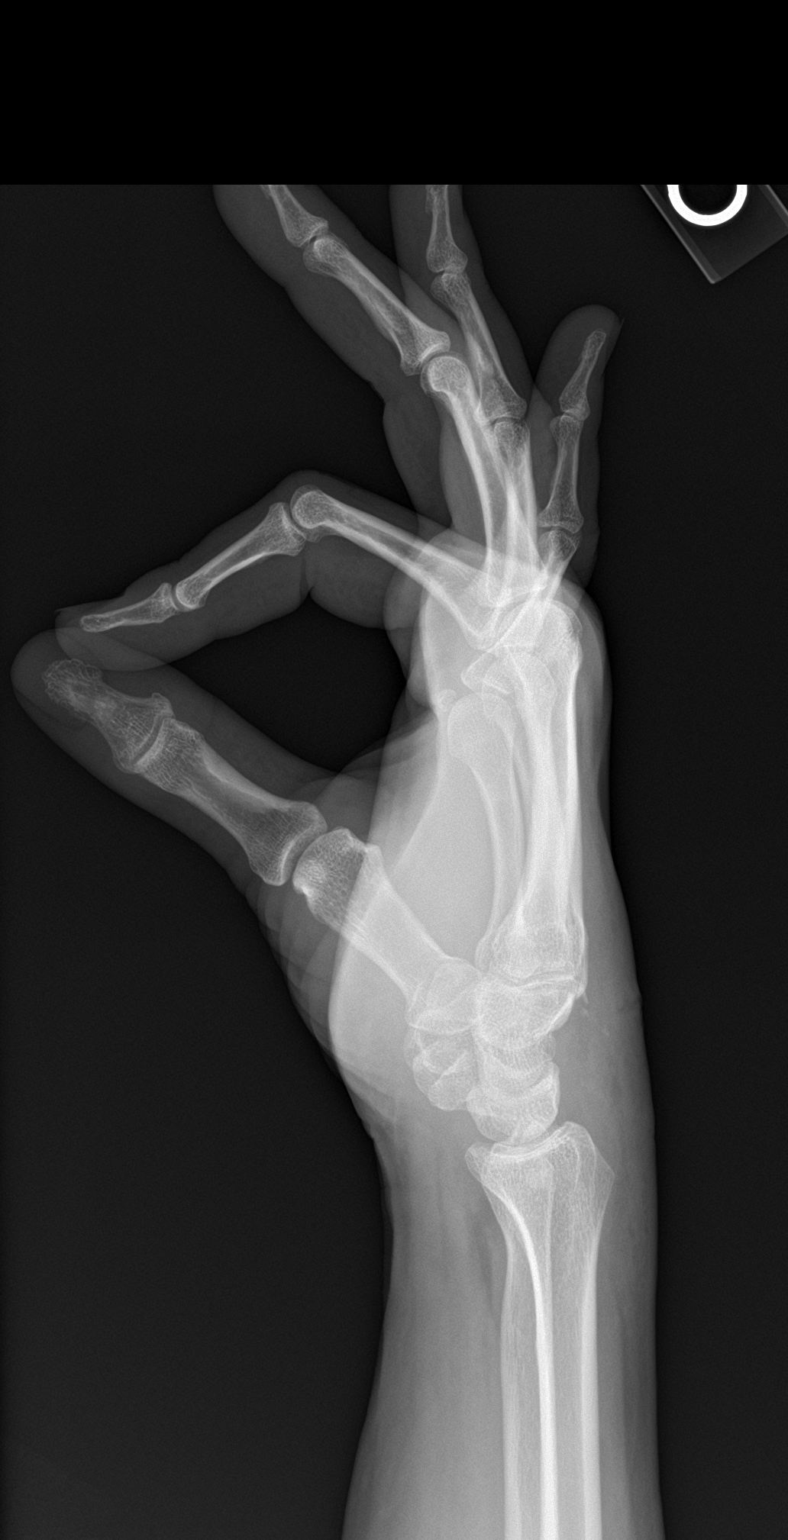

[3 of 3 positions shown; findings below may reference images not displayed]

FINDINGS: Frontal, oblique, and lateral views of the right hand are obtained.
On the oblique and lateral projections there are small ossific
densities dorsal to the base of the fifth metacarpal. This could
reflect fracture or radiopaque foreign body given history of animal
bite. There is overlying soft tissue swelling and soft tissue
defect. No other acute bony abnormalities. Bone island within the
radius. Joint spaces are well preserved.
IMPRESSION: 1. Small ossific densities dorsal to the base of the fifth
metacarpal, at the site of soft tissue injury. I would suspect this
reflects fracture of the base of the fifth metacarpal rather than
radiopaque foreign body.

## 2021-07-26 ENCOUNTER — Other Ambulatory Visit: Payer: Self-pay | Admitting: Family Medicine

## 2021-07-26 MED ORDER — AMOXICILLIN-POT CLAVULANATE 875-125 MG PO TABS: 1.0000 | ORAL_TABLET | Freq: Two times a day (BID) | ORAL | 0 refills | Status: AC
